# Patient Record
Sex: Male | Born: 1990 | Race: White | Hispanic: No | Marital: Single | Smoking: Never smoker
Health system: Southern US, Community
[De-identification: ages and names within clinical notes are randomized; demographics above are authoritative.]

---

## 2014-03-15 ENCOUNTER — Encounter (HOSPITAL_COMMUNITY): Payer: Self-pay | Admitting: Emergency Medicine

## 2014-03-15 ENCOUNTER — Emergency Department (HOSPITAL_COMMUNITY)
Admission: EM | Admit: 2014-03-15 | Discharge: 2014-03-16 | Disposition: A | Discharge status: Home / Self Care | Payer: BC Managed Care – HMO | Attending: Emergency Medicine | Admitting: Emergency Medicine

## 2014-03-15 DIAGNOSIS — Z23 Encounter for immunization: Secondary | ICD-10-CM | POA: Insufficient documentation

## 2014-03-15 DIAGNOSIS — Z203 Contact with and (suspected) exposure to rabies: Secondary | ICD-10-CM | POA: Insufficient documentation

## 2014-03-15 MED ORDER — RABIES IMMUNE GLOBULIN 150 UNIT/ML IM INJ
1500.0000 [IU] | INJECTION | Freq: Once | INTRAMUSCULAR | Status: AC
  Administered 2014-03-16: 1500 [IU] via INTRAMUSCULAR
  Filled 2014-03-15: qty 10

## 2014-03-15 MED ORDER — RABIES IMMUNE GLOBULIN 150 UNIT/ML IM INJ: 20.0000 [IU]/kg | INJECTION | Freq: Once | INTRAMUSCULAR | Status: DC

## 2014-03-15 MED ORDER — RABIES VACCINE, PCEC IM SUSR
1.0000 mL | Freq: Once | INTRAMUSCULAR | Status: AC
  Administered 2014-03-16: 1 mL via INTRAMUSCULAR
  Filled 2014-03-15: qty 1

## 2014-03-15 NOTE — Discharge Instructions (Signed)
Rabies  °Rabies is a viral infection that can be spread to people from infected animals. The infection affects the brain and central nervous system. Once the disease develops, it almost always causes death. Because of this, when a person is bitten by an animal that may have rabies, treatment to prevent rabies often needs to be started whether or not the animal is known to be infected. Prompt treatment with the rabies vaccine and rabies immune globulin is very effective at preventing the infection from developing in people who have been exposed to the rabies virus. °CAUSES  °Rabies is caused by a virus that lives inside some animals. When a person is bitten by an infected animal, the rabies virus is spread to the person through the infected spit (saliva) of the animal. This virus can be carried by animals such as dogs, cats, skunks, bats, woodchucks, raccoons, coyotes, and foxes. °SYMPTOMS  °By the time symptoms appear, rabies is usually fatal for the person. Common symptoms include: °· Headache. °· Fever. °· Fatigue and weakness. °· Agitation. °· Anxiety. °· Confusion. °· Unusual behavior, such as hyperactivity, fear of water (hydrophobia), or fear of air (aerophobia). °· Hallucinations. °· Insomnia. °· Weakness in the arms or legs. °· Difficulty swallowing. °Most people get sick in 1-3 months after being bitten. This often varies and may depend on the location of the bite. The infection will take less time to develop if the bite occurred closer to the head.  °DIAGNOSIS  °To determine if a person is infected, several tests must be performed, such as: °· A skin biopsy. °· A saliva test. °· A lumbar puncture to remove spinal fluid so it can be examined. °· Blood tests. °TREATMENT  °Treatment to prevent the infection from developing (post-exposure prophylaxis, PEP) is often started before knowing for sure if the person has been exposed to the rabies virus. PEP involves cleaning the wound, giving an antibody injection  (rabies immune globulin), and giving a series of rabies vaccine injections. The series of injections are usually given over a two-week period. If possible, the animal that bit the person will be observed to see if it remains healthy. If the animal has been killed, it can be sent to a state laboratory and examined to see if the animal had rabies. °If a person is bitten by a domestic animal (dog, cat, or ferret) that appears healthy and can be observed to see if it remains healthy, often no further treatment is necessary other than care of the wounds caused by the animal. °Rabies is often a fatal illness once the infection develops in a person. Although a few people who developed rabies have survived after experimental treatment with certain drugs, all these survivors still had severe nervous system problems after the treatment. This is why caregivers use extra caution and begin PEP treatment for people who have been bitten by animals that are possibly infected with rabies.  °HOME CARE INSTRUCTIONS  °If you were bitten by an unknown animal, make sure you know your caregiver's instructions for follow-up. If the animal was sent to a laboratory for examination, ask when the test results will be ready. Make sure you get the test results.  °Take these steps to care for your wound: °· Keep the wound clean, dry, and dressed as directed by your caregiver. °· Keep the injured part elevated as much as possible. °· Do not resume use of the affected area until directed. °· Only take over-the-counter or prescription medicines as directed by   your caregiver. °· Keep all follow-up appointments as directed by your caregiver. °PREVENTION  °To prevent rabies, people need to reduce their risk of having contact with infected animals.  °· Make sure your pets (dogs, cats, ferrets) are vaccinated against rabies. Keep these vaccinations up-to-date as directed by your veterinarian. °· Supervise your pets when they are outside. Keep them away  from wild animals. °· Call your local animal control services to report any stray animals. These animals may not be vaccinated. °· Stay away from stray or wild animals. °· Consider getting the rabies vaccine (preexposure) if you are traveling to an area where rabies is common or if your job or activities involve possible contact with wild or stray animals. Discuss this with your caregiver. °Document Released: 08/18/2005 Document Revised: 05/12/2012 Document Reviewed: 03/16/2012 °[employer]® Patient Information ©2015 [employer]. This information is not intended to replace advice given to you by your health care provider. Make sure you discuss any questions you have with your health care provider. ° °Rabies Vaccine °What You Need to Know °WHAT IS RABIES? °· Rabies is a serious disease. It is caused by a virus. °· Rabies is mainly a disease of animals. Humans get rabies when they are bitten by infected animals. °· At first there might not be any symptoms. But weeks, or even years after a bite, rabies can cause pain, fatigue, headaches, fever, and irritability. These are followed by seizures, hallucinations, and paralysis. Human rabies is almost always fatal. °· Wild animals, especially bats, are the most common source of human rabies infection in the [place]. Skunks, raccoons, dogs, cats, coyotes, foxes, and other mammals can also transmit the disease. °· Human rabies is rare in the [place]. There have been only 55 cases diagnosed since 1990. However, between 16,000 and 39,000 people are vaccinated each year as a precaution after animal bites. Also, rabies is far more common in other parts of the world, with about 40,000 to 70,000 rabies-related deaths worldwide each year. Bites from unvaccinated dogs cause most of these cases. °Rabies vaccine can prevent rabies. °RABIES VACCINE °· Rabies vaccine is given to people at high risk of rabies to protect them if they are exposed. It can also prevent the disease  if it is given to a person after they have been exposed. °· Rabies vaccine is made from killed rabies virus. It cannot cause rabies. °WHO SHOULD GET RABIES VACCINE AND WHEN? °Preventive Vaccination (No Exposure) °· People at high risk of exposure to rabies, such as veterinarians, animal handlers, rabies laboratory workers, spelunkers, and rabies biologics production workers should be offered rabies vaccine. °· The vaccine should also be considered for: °¨ People whose activities bring them into frequent contact with rabies virus or with possibly rabid animals. °¨ International travelers who are likely to come in contact with animals in parts of the world where rabies is common. °· The pre-exposure schedule for rabies vaccination is 3 doses, given at the following times: °¨ Dose 1: As appropriate. °¨ Dose 2: 7 days after Dose 1. °¨ Dose 3: 21 days or 28 days after Dose 1. °· For laboratory workers and others who may be repeatedly exposed to rabies virus, periodic testing for immunity is recommended and booster doses should be given as needed. (Testing or booster doses are not recommended for travelers). Ask your doctor for details. °Vaccination After an Exposure °Anyone who has been bitten by an animal, or who otherwise may have been exposed to rabies, should clean the wound   and see a doctor immediately. The doctor will determine if they need to be vaccinated. A person who is exposed and has never been vaccinated against rabies should get 4 doses of rabies vaccine: one dose right away and additional doses on the 3rd, 7th, and 14th days. They should also get another shot called Rabies Immune Globulin at the same time as the first dose.  A person who has been previously vaccinated should get 2 doses of rabies vaccine: one right away and another on the 3rd day. Rabies Immune Globulin is not needed. TELL YOUR DOCTOR IF: Talk with a doctor before getting rabies vaccine if you:  Ever had a serious (life-threatening)  allergic reaction to a previous dose of rabies vaccine or to any component of the vaccine; tell your doctor if you have any severe allergies.  Have a weakened immune system because of:  HIV, AIDS, or another disease that affects the immune system.  Treatment with drugs that affect the immune system, such as steroids.  Cancer or cancer treatment with radiation or drugs. If you have a minor illness, such as a cold, you can be vaccinated. If you are moderately or severely ill, you should probably wait until you recover before getting a routine (non-exposure) dose of rabies vaccine. If you have been exposed to rabies virus, you should get the vaccine regardless of any other illnesses you may have. WHAT ARE THE RISKS FROM RABIES VACCINE? A vaccine, like any medicine, is capable of causing serious problems, such as severe allergic reactions. The risk of a vaccine causing serious harm, or death, is extremely small. Serious problems from rabies vaccine are very rare.  Mild problems:  Soreness, redness, swelling, or itching where the shot was given (30% to 74%).  Headache, nausea, abdominal pain, muscle aches, or dizziness (5% to 40%). Moderate problems:  Hives, pain in the joints, or fever (about 6% of booster doses).  Other nervous system disorders, such as Guillain-Barr Syndrome (GBS), have been reported after rabies vaccine, but this happens so rarely that it is not known whether they are related to the vaccine. Note: Several brands of rabies vaccine are available in the [place], and reactions may vary between brands. Your provider can give you more information about a particular brand. WHAT IF THERE IS A SERIOUS REACTION? What should I look for? Look for anything that concerns you, such as signs of a severe allergic reaction, very high fever, or behavior changes.  Signs of a severe allergic reaction can include hives, swelling of the face and throat, difficulty breathing, a fast  heartbeat, dizziness, and weakness. These would start a few minutes to a few hours after the vaccination. What should I do?  If you think it is a severe allergic reaction or other emergency that cannot wait, call 911 or get the person to the nearest hospital. Otherwise, call your doctor.  Afterward, the reaction should be reported to the Vaccine Adverse Event Reporting System (VAERS). Your doctor might file this report, or you can do it yourself through the VAERS website at www.vaers.SamedayNews.es or by calling [phone]. VAERS is only for reporting reactions. They do not give medical advice. HOW CAN I LEARN MORE?  Ask your doctor.  Call your local or state health department.  Contact the Centers for Disease Control and Prevention (CDC):  Visit the CDC rabies website at [place].no CDC Rabies Vaccine VIS (06/06/08) Document Released: 06/15/2006 Document Revised: 08/04/2012 Document Reviewed: 12/08/2012 [facility] Patient Information 2015 [place]. This information  is not intended to replace advice given to you by your health care provider. Make sure you discuss any questions you have with your health care provider.   Return on 7/19, 7/22 and 7/29 for repeat rabies dose
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
## 2014-03-16 NOTE — ED Notes (Signed)
Pt's respirations are equal and non labored. 

## 2014-09-30 ENCOUNTER — Inpatient Hospital Stay (HOSPITAL_COMMUNITY): Payer: BLUE CROSS/BLUE SHIELD

## 2014-09-30 ENCOUNTER — Emergency Department (HOSPITAL_COMMUNITY): Payer: BLUE CROSS/BLUE SHIELD

## 2014-09-30 ENCOUNTER — Encounter (HOSPITAL_COMMUNITY): Payer: Self-pay | Admitting: Cardiology

## 2014-09-30 ENCOUNTER — Inpatient Hospital Stay (HOSPITAL_COMMUNITY)
Admission: EM | Admit: 2014-09-30 | Discharge: 2014-10-03 | DRG: 155 | Disposition: A | Discharge status: Home / Self Care | Payer: BLUE CROSS/BLUE SHIELD | Attending: General Surgery | Admitting: General Surgery

## 2014-09-30 DIAGNOSIS — S060XAA Concussion with loss of consciousness status unknown, initial encounter: Secondary | ICD-10-CM | POA: Diagnosis present

## 2014-09-30 DIAGNOSIS — G960 Cerebrospinal fluid leak: Secondary | ICD-10-CM | POA: Diagnosis present

## 2014-09-30 DIAGNOSIS — S060X9A Concussion with loss of consciousness of unspecified duration, initial encounter: Secondary | ICD-10-CM | POA: Diagnosis present

## 2014-09-30 DIAGNOSIS — T148 Other injury of unspecified body region: Secondary | ICD-10-CM | POA: Diagnosis present

## 2014-09-30 DIAGNOSIS — S0121XA Laceration without foreign body of nose, initial encounter: Secondary | ICD-10-CM | POA: Diagnosis present

## 2014-09-30 DIAGNOSIS — S0219XA Other fracture of base of skull, initial encounter for closed fracture: Secondary | ICD-10-CM | POA: Diagnosis present

## 2014-09-30 DIAGNOSIS — T148XXA Other injury of unspecified body region, initial encounter: Secondary | ICD-10-CM

## 2014-09-30 DIAGNOSIS — G9389 Other specified disorders of brain: Secondary | ICD-10-CM | POA: Diagnosis present

## 2014-09-30 DIAGNOSIS — G9601 Cranial cerebrospinal fluid leak, spontaneous: Secondary | ICD-10-CM | POA: Diagnosis present

## 2014-09-30 DIAGNOSIS — S0292XA Unspecified fracture of facial bones, initial encounter for closed fracture: Secondary | ICD-10-CM | POA: Diagnosis present

## 2014-09-30 DIAGNOSIS — S022XXA Fracture of nasal bones, initial encounter for closed fracture: Secondary | ICD-10-CM | POA: Diagnosis present

## 2014-09-30 DIAGNOSIS — S0285XA Fracture of orbit, unspecified, initial encounter for closed fracture: Secondary | ICD-10-CM

## 2014-09-30 LAB — COMPREHENSIVE METABOLIC PANEL
ALT: 29 U/L (ref 0–53)
AST: 35 U/L (ref 0–37)
Albumin: 4.1 g/dL (ref 3.5–5.2)
Alkaline Phosphatase: 85 U/L (ref 39–117)
Anion gap: 12 (ref 5–15)
BUN: 14 mg/dL (ref 6–23)
CHLORIDE: 104 mmol/L (ref 96–112)
CO2: 23 mmol/L (ref 19–32)
CREATININE: 1.07 mg/dL (ref 0.50–1.35)
Calcium: 9 mg/dL (ref 8.4–10.5)
GFR calc Af Amer: >90 mL/min (ref >90)
Glucose, Bld: 154 mg/dL — ABNORMAL HIGH (ref 70–99)
POTASSIUM: 2.9 mmol/L — AB (ref 3.5–5.1)
Sodium: 139 mmol/L (ref 135–145)
Total Bilirubin: 0.7 mg/dL (ref 0.3–1.2)
Total Protein: 6.7 g/dL (ref 6.0–8.3)

## 2014-09-30 LAB — CBC
HCT: 41.2 % (ref 39.0–52.0)
HEMOGLOBIN: 14.3 g/dL (ref 13.0–17.0)
MCH: 29.5 pg (ref 26.0–34.0)
MCHC: 34.7 g/dL (ref 30.0–36.0)
MCV: 85.1 fL (ref 78.0–100.0)
PLATELETS: 210 10*3/uL (ref 150–400)
RBC: 4.84 MIL/uL (ref 4.22–5.81)
RDW: 12.6 % (ref 11.5–15.5)
WBC: 16.8 10*3/uL — ABNORMAL HIGH (ref 4.0–10.5)

## 2014-09-30 LAB — SAMPLE TO BLOOD BANK

## 2014-09-30 LAB — CDS SEROLOGY

## 2014-09-30 LAB — PROTIME-INR
INR: 1.09 (ref 0.00–1.49)
PROTHROMBIN TIME: 14.2 s (ref 11.6–15.2)

## 2014-09-30 LAB — ETHANOL: Alcohol, Ethyl (B): <5 mg/dL (ref 0–9)

## 2014-09-30 MED ORDER — CHLORHEXIDINE GLUCONATE 0.12 % MT SOLN
15.0000 mL | Freq: Two times a day (BID) | OROMUCOSAL | Status: DC
  Administered 2014-10-01 – 2014-10-02 (×3): 15 mL via OROMUCOSAL
  Filled 2014-09-30 (×5): qty 15

## 2014-09-30 MED ORDER — ONDANSETRON HCL 4 MG/2ML IJ SOLN
INTRAMUSCULAR | Status: AC
  Administered 2014-09-30: 4 mg via INTRAVENOUS
  Filled 2014-09-30: qty 2

## 2014-09-30 MED ORDER — IOHEXOL 350 MG/ML SOLN
50.0000 mL | Freq: Once | INTRAVENOUS | Status: AC | PRN
  Administered 2014-09-30: 50 mL via INTRAVENOUS

## 2014-09-30 MED ORDER — SODIUM CHLORIDE 0.9 % IV SOLN
1.5000 g | Freq: Four times a day (QID) | INTRAVENOUS | Status: DC
  Administered 2014-09-30 – 2014-10-02 (×5): 1.5 g via INTRAVENOUS
  Filled 2014-09-30 (×10): qty 1.5

## 2014-09-30 MED ORDER — PANTOPRAZOLE SODIUM 40 MG PO TBEC
40.0000 mg | DELAYED_RELEASE_TABLET | Freq: Every day | ORAL | Status: DC
  Administered 2014-10-02: 40 mg via ORAL
  Filled 2014-09-30: qty 1

## 2014-09-30 MED ORDER — PANTOPRAZOLE SODIUM 40 MG IV SOLR
40.0000 mg | Freq: Every day | INTRAVENOUS | Status: DC
  Administered 2014-10-01: 40 mg via INTRAVENOUS
  Filled 2014-09-30: qty 40

## 2014-09-30 MED ORDER — POTASSIUM CHLORIDE IN NACL 20-0.9 MEQ/L-% IV SOLN
INTRAVENOUS | Status: DC
  Administered 2014-09-30 – 2014-10-02 (×4): via INTRAVENOUS
  Filled 2014-09-30 (×5): qty 1000

## 2014-09-30 MED ORDER — ONDANSETRON HCL 4 MG/2ML IJ SOLN
4.0000 mg | Freq: Once | INTRAMUSCULAR | Status: AC
Start: 2014-09-30 — End: 2014-09-30
  Administered 2014-09-30: 4 mg via INTRAVENOUS

## 2014-09-30 MED ORDER — TETANUS-DIPHTH-ACELL PERTUSSIS 5-2.5-18.5 LF-MCG/0.5 IM SUSP
0.5000 mL | Freq: Once | INTRAMUSCULAR | Status: AC
  Administered 2014-09-30: 0.5 mL via INTRAMUSCULAR
  Filled 2014-09-30: qty 0.5

## 2014-09-30 MED ORDER — ONDANSETRON HCL 4 MG PO TABS: 4.0000 mg | ORAL_TABLET | Freq: Four times a day (QID) | ORAL | Status: DC | PRN

## 2014-09-30 MED ORDER — LIDOCAINE-EPINEPHRINE 1 %-1:100000 IJ SOLN
10.0000 mL | Freq: Once | INTRAMUSCULAR | Status: AC
  Administered 2014-09-30: 10 mL
  Filled 2014-09-30: qty 1

## 2014-09-30 MED ORDER — ONDANSETRON HCL 4 MG/2ML IJ SOLN
4.0000 mg | Freq: Four times a day (QID) | INTRAMUSCULAR | Status: DC | PRN
  Administered 2014-10-01: 4 mg via INTRAVENOUS
  Filled 2014-09-30 (×2): qty 2

## 2014-09-30 MED ORDER — CETYLPYRIDINIUM CHLORIDE 0.05 % MT LIQD
7.0000 mL | Freq: Two times a day (BID) | OROMUCOSAL | Status: DC
  Administered 2014-10-01: 7 mL via OROMUCOSAL

## 2014-09-30 MED ORDER — MORPHINE SULFATE 2 MG/ML IJ SOLN
2.0000 mg | INTRAMUSCULAR | Status: DC | PRN
  Administered 2014-10-01: 0.5 mg via INTRAVENOUS
  Administered 2014-10-02: 1 mg via INTRAVENOUS
  Filled 2014-09-30 (×2): qty 1
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
## 2014-09-30 NOTE — ED Notes (Signed)
Patient transported to CT 

## 2014-09-30 NOTE — ED Notes (Signed)
ENT physician at bedside
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
## 2014-09-30 NOTE — ED Notes (Signed)
Attempted report 

## 2014-09-30 NOTE — ED Notes (Signed)
Trauma MD at bedside.
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
## 2014-10-01 LAB — BASIC METABOLIC PANEL
Anion gap: 7 (ref 5–15)
BUN: 13 mg/dL (ref 6–23)
CO2: 26 mmol/L (ref 19–32)
CREATININE: 1.01 mg/dL (ref 0.50–1.35)
Calcium: 8.9 mg/dL (ref 8.4–10.5)
Chloride: 104 mmol/L (ref 96–112)
GFR calc Af Amer: >90 mL/min (ref >90)
Glucose, Bld: 160 mg/dL — ABNORMAL HIGH (ref 70–99)
Potassium: 3.8 mmol/L (ref 3.5–5.1)
Sodium: 137 mmol/L (ref 135–145)

## 2014-10-01 LAB — CBC
HCT: 39.6 % (ref 39.0–52.0)
HEMOGLOBIN: 13.5 g/dL (ref 13.0–17.0)
MCH: 29.3 pg (ref 26.0–34.0)
MCHC: 34.1 g/dL (ref 30.0–36.0)
MCV: 86.1 fL (ref 78.0–100.0)
Platelets: 189 10*3/uL (ref 150–400)
RBC: 4.6 MIL/uL (ref 4.22–5.81)
RDW: 12.8 % (ref 11.5–15.5)
WBC: 13.3 10*3/uL — ABNORMAL HIGH (ref 4.0–10.5)

## 2014-10-01 MED ORDER — BACITRACIN-NEOMYCIN-POLYMYXIN OINTMENT TUBE
TOPICAL_OINTMENT | Freq: Three times a day (TID) | CUTANEOUS | Status: DC
  Administered 2014-10-01: 1 via TOPICAL
  Administered 2014-10-01: 16:00:00 via TOPICAL
  Administered 2014-10-01: 1 via TOPICAL
  Administered 2014-10-01 – 2014-10-03 (×4): via TOPICAL
  Filled 2014-10-01: qty 15

## 2014-10-01 NOTE — Progress Notes (Signed)
[facility] and [facility]. Patient walked to bed, and is reclining with HOB@ 30 degrees.Patient and famikly educated to safety plan, bed in low position, call bell in reach, bed alarm on.

## 2014-10-01 NOTE — Progress Notes (Signed)
Drs Franky Machoabbell and Tsuei notified of probable CSF drainage from nose. No new orders at this time.

## 2014-10-01 NOTE — Progress Notes (Signed)
Dr Janee Mornhompson called to confirm that Neuro MD has been notified of referral, also informed of pt being lethargic and inappropriate at times.  Pt will awaken when spoken to and answers all questions appropriately most of the time, with exception to 2 times when pt thought he was "on the other side of 85" and "working on hydraulics". Right pupil 2 ans reactive, Left pupil is sluggish.  No new orders received.

## 2014-10-01 NOTE — Progress Notes (Signed)
PT Cancellation Note  Patient Details Name: Randy LenisLuke Dicamillo MRN: [id number] DOB: 04/09/1991   Cancelled Treatment:    Reason Eval/Treat Not Completed: Medical issues which prohibited therapy.  Patient on bedrest per orders.  MD:  Please write activity orders when appropriate for patient.  PT will initiate evaluation at that time.  Thank you.   Vena AustriaDavis, Anayeli Arel H 10/01/2014, 9:31 AM Durenda HurtSusan H. Renaldo Fiddleravis, PT, [facility] Acute Rehab Services Pager [phone]
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
## 2014-10-01 NOTE — Progress Notes (Signed)
Noted a intermittent trickle of blood tinged fluid from left nares throughout  the night, no other changes noted. Dr Janee Mornhompson notified, no new orders.
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
## 2014-10-02 DIAGNOSIS — S060X9A Concussion with loss of consciousness of unspecified duration, initial encounter: Secondary | ICD-10-CM | POA: Diagnosis present

## 2014-10-02 DIAGNOSIS — S060XAA Concussion with loss of consciousness status unknown, initial encounter: Secondary | ICD-10-CM | POA: Diagnosis present

## 2014-10-02 DIAGNOSIS — G960 Cerebrospinal fluid leak: Secondary | ICD-10-CM

## 2014-10-02 DIAGNOSIS — G9389 Other specified disorders of brain: Secondary | ICD-10-CM

## 2014-10-02 DIAGNOSIS — G9601 Cranial cerebrospinal fluid leak, spontaneous: Secondary | ICD-10-CM | POA: Diagnosis present

## 2014-10-02 MED ORDER — ACETAMINOPHEN 325 MG PO TABS: 650.0000 mg | ORAL_TABLET | ORAL | Status: DC | PRN

## 2014-10-02 MED ORDER — HYDROCODONE-ACETAMINOPHEN 10-325 MG PO TABS
0.5000 | ORAL_TABLET | ORAL | Status: DC | PRN
  Administered 2014-10-02: 1 via ORAL
  Filled 2014-10-02: qty 1

## 2014-10-02 MED ORDER — MORPHINE SULFATE 2 MG/ML IJ SOLN: 2.0000 mg | INTRAMUSCULAR | Status: DC | PRN

## 2014-10-02 MED ORDER — CIPROFLOXACIN HCL 500 MG PO TABS
500.0000 mg | ORAL_TABLET | Freq: Two times a day (BID) | ORAL | Status: DC
  Administered 2014-10-02 – 2014-10-03 (×3): 500 mg via ORAL
  Filled 2014-10-02 (×3): qty 1

## 2014-10-02 MED ORDER — IBUPROFEN 200 MG PO TABS
600.0000 mg | ORAL_TABLET | Freq: Four times a day (QID) | ORAL | Status: DC | PRN
  Administered 2014-10-03: 600 mg via ORAL
  Filled 2014-10-02: qty 3

## 2014-10-02 MED ORDER — ENOXAPARIN SODIUM 40 MG/0.4ML ~~LOC~~ SOLN
40.0000 mg | SUBCUTANEOUS | Status: DC
  Administered 2014-10-02: 40 mg via SUBCUTANEOUS
  Filled 2014-10-02 (×2): qty 0.4
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
## 2014-10-02 NOTE — Progress Notes (Signed)
UR completed.  Cherylynn Liszewski, RN BSN [facility] CCM Trauma/Neuro ICU Case Manager [phone]  
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
## 2014-10-02 NOTE — Progress Notes (Signed)
Family called RN because patient was "trying to send food for a brother" and family is concern that patient is hallucinating. Patient verbalized to RN that he just wanted one of his "friend" to have the left over on the table. RN assessed patient and he appears fine. Family concerns that he is sleeping way too much today although earlier complaints that patient hasn't slept since he has been in the hospital. Patient noted with agitation when family continue to questioning if he is ok. Family verbalized for RN to ask if he is in pain and felt that although patient is in true pain but doesn't want to let anyone know. Patient denied pain at this time. RN will continue to round. Emotional support given to family. Will continue to monitor.   Sim BoastHavy, RN

## 2014-10-02 NOTE — Progress Notes (Signed)
Patient denied pain today. Although there is oral norco 10mg  ordered but family requested for tylenol or motrin. Dr Donell BeersByerly called back with orders. Family notified.   Sim BoastHavy, RN
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
## 2014-10-02 NOTE — Progress Notes (Signed)
Patient sitting in chair at this time. RN and family noted some discharge from nose characterized by clear light browish color. Trauma call, Dr, Lindie SpruceWyatt called back and aware of it. No new order obtained. Will continue to monitor.   Sim BoastHavy, RN
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
## 2014-10-03 MED ORDER — CIPROFLOXACIN HCL 500 MG PO TABS: 500.0000 mg | ORAL_TABLET | Freq: Two times a day (BID) | ORAL | Status: AC

## 2014-10-03 MED ORDER — HYDROCODONE-ACETAMINOPHEN 5-325 MG PO TABS: 1.0000 | ORAL_TABLET | ORAL | Status: AC | PRN

## 2014-10-03 NOTE — Discharge Planning (Signed)
Spoke to patient about tub bench and the cost and he declined it.

## 2014-10-03 NOTE — Progress Notes (Signed)
Discharge-reviewed all new medications, Cipro 500mg  tablet PO and hydrocodone-acetaminophen 5-325mg  per tablet, PRN.  Reviewed safety equipment with activity. Patient verbalized understanding. RXs given to family, no other questions or concerns voiced at this time. Pt transport by family.
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
## 2014-10-03 NOTE — Discharge Instructions (Signed)
No nose blowing, sneeze and cough with your mouth open.  No driving while taking hydrocodone.
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
## 2023-06-06 ENCOUNTER — Encounter (HOSPITAL_BASED_OUTPATIENT_CLINIC_OR_DEPARTMENT_OTHER): Payer: Self-pay

## 2023-06-06 ENCOUNTER — Other Ambulatory Visit: Payer: Self-pay

## 2023-06-06 ENCOUNTER — Emergency Department (HOSPITAL_BASED_OUTPATIENT_CLINIC_OR_DEPARTMENT_OTHER)
Admission: EM | Admit: 2023-06-06 | Discharge: 2023-06-06 | Disposition: A | Discharge status: Home / Self Care | Payer: Medicaid Other | Attending: Emergency Medicine | Admitting: Emergency Medicine

## 2023-06-06 DIAGNOSIS — S61411A Laceration without foreign body of right hand, initial encounter: Secondary | ICD-10-CM | POA: Insufficient documentation

## 2023-06-06 DIAGNOSIS — R519 Headache, unspecified: Secondary | ICD-10-CM | POA: Diagnosis not present

## 2023-06-06 DIAGNOSIS — Z23 Encounter for immunization: Secondary | ICD-10-CM | POA: Insufficient documentation

## 2023-06-06 DIAGNOSIS — Y92410 Unspecified street and highway as the place of occurrence of the external cause: Secondary | ICD-10-CM | POA: Diagnosis not present

## 2023-06-06 MED ORDER — TETANUS-DIPHTH-ACELL PERTUSSIS 5-2.5-18.5 LF-MCG/0.5 IM SUSY
0.5000 mL | PREFILLED_SYRINGE | Freq: Once | INTRAMUSCULAR | Status: AC
  Administered 2023-06-06: 0.5 mL via INTRAMUSCULAR
  Filled 2023-06-06: qty 0.5

## 2023-06-06 NOTE — ED Triage Notes (Signed)
Pt presents with a HA and jaw pain. Pt is concerned for tetanus because he had a finger lac 1 week ago. Pt is requesting a vaccine.

## 2023-06-06 NOTE — Discharge Instructions (Signed)
Today you were seen for Tdap update.  Thank you for letting us treat you today. After updating her Tdap, I feel you are safe to go home. Please follow up with your PCP in the next several days and provide them with your records from this visit. Return to the Emergency Room if pain becomes severe or symptoms worsen.
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
